# Patient Record
Sex: Female | Born: 1960 | Race: White | Hispanic: No | Marital: Single | Smoking: Current every day smoker
Health system: Southern US, Community
[De-identification: ages and names within clinical notes are randomized; demographics above are authoritative.]

## PROBLEM LIST (undated history)

## (undated) DIAGNOSIS — T8859XA Other complications of anesthesia, initial encounter: Secondary | ICD-10-CM

## (undated) DIAGNOSIS — Z8489 Family history of other specified conditions: Secondary | ICD-10-CM

## (undated) DIAGNOSIS — F32A Depression, unspecified: Secondary | ICD-10-CM

## (undated) DIAGNOSIS — Z8719 Personal history of other diseases of the digestive system: Secondary | ICD-10-CM

## (undated) DIAGNOSIS — F329 Major depressive disorder, single episode, unspecified: Secondary | ICD-10-CM

## (undated) DIAGNOSIS — T4145XA Adverse effect of unspecified anesthetic, initial encounter: Secondary | ICD-10-CM

## (undated) DIAGNOSIS — M719 Bursopathy, unspecified: Secondary | ICD-10-CM

## (undated) DIAGNOSIS — M1990 Unspecified osteoarthritis, unspecified site: Secondary | ICD-10-CM

## (undated) DIAGNOSIS — Z87898 Personal history of other specified conditions: Secondary | ICD-10-CM

## (undated) HISTORY — PX: RETINAL DETACHMENT SURGERY: SHX105

## (undated) HISTORY — PX: GUM SURGERY: SHX658

## (undated) HISTORY — PX: EYE SURGERY: SHX253

## (undated) HISTORY — PX: COLONOSCOPY: SHX174

## (undated) HISTORY — PX: UPPER GI ENDOSCOPY: SHX6162

---
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
## 1988-11-30 HISTORY — PX: CATARACT EXTRACTION W/ INTRAOCULAR LENS  IMPLANT, BILATERAL: SHX1307

## 2000-01-31 ENCOUNTER — Ambulatory Visit (HOSPITAL_COMMUNITY): Admission: RE | Admit: 2000-01-31 | Discharge: 2000-02-01 | Payer: Self-pay | Admitting: Ophthalmology

## 2000-01-31 ENCOUNTER — Encounter: Payer: Self-pay | Admitting: Ophthalmology

## 2004-02-21 ENCOUNTER — Other Ambulatory Visit: Admission: RE | Admit: 2004-02-21 | Discharge: 2004-02-21 | Payer: Self-pay | Admitting: Family Medicine

## 2004-04-26 ENCOUNTER — Ambulatory Visit (HOSPITAL_COMMUNITY): Admission: RE | Admit: 2004-04-26 | Discharge: 2004-04-27 | Payer: Self-pay | Admitting: Ophthalmology

## 2005-03-13 ENCOUNTER — Other Ambulatory Visit: Admission: RE | Admit: 2005-03-13 | Discharge: 2005-03-13 | Payer: Self-pay | Admitting: Family Medicine

## 2007-04-07 ENCOUNTER — Encounter: Admission: RE | Admit: 2007-04-07 | Discharge: 2007-05-15 | Payer: Self-pay | Admitting: Orthopedic Surgery

## 2007-12-30 ENCOUNTER — Ambulatory Visit (HOSPITAL_COMMUNITY): Admission: RE | Admit: 2007-12-30 | Discharge: 2007-12-30 | Payer: Self-pay | Admitting: Gastroenterology

## 2008-04-20 ENCOUNTER — Other Ambulatory Visit: Admission: RE | Admit: 2008-04-20 | Discharge: 2008-04-20 | Payer: Self-pay | Admitting: Family Medicine

## 2009-11-23 IMAGING — US US ABDOMEN COMPLETE
1 series · 14 of 25 positions shown · non-contrast
Comparison: No priors

CLINICAL DATA: Abdominal pain/weight loss

ABDOMEN ULTRASOUND
TECHNIQUE: Complete abdominal ultrasound examination was performed
including evaluation of the liver, gallbladder, bile ducts,
pancreas, kidneys, spleen, IVC, and abdominal aorta.

[Series 1: unknown · 0.22mm/px · 14 of 57 slices shown]
[im 1/57]
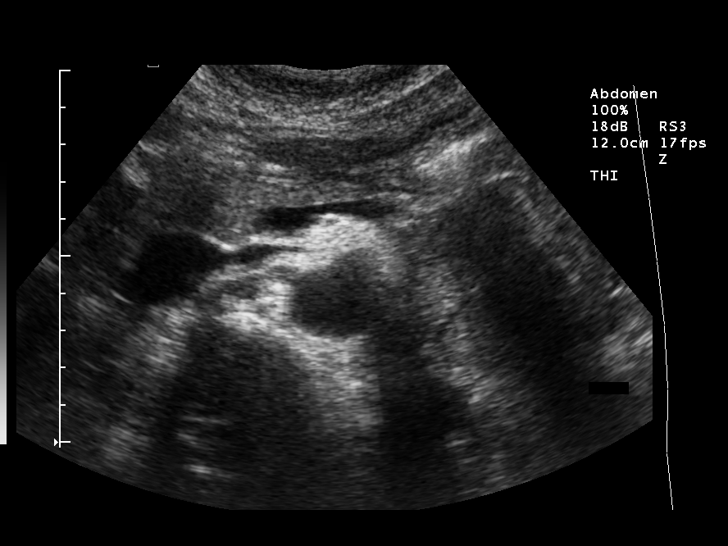
[im 5/57]
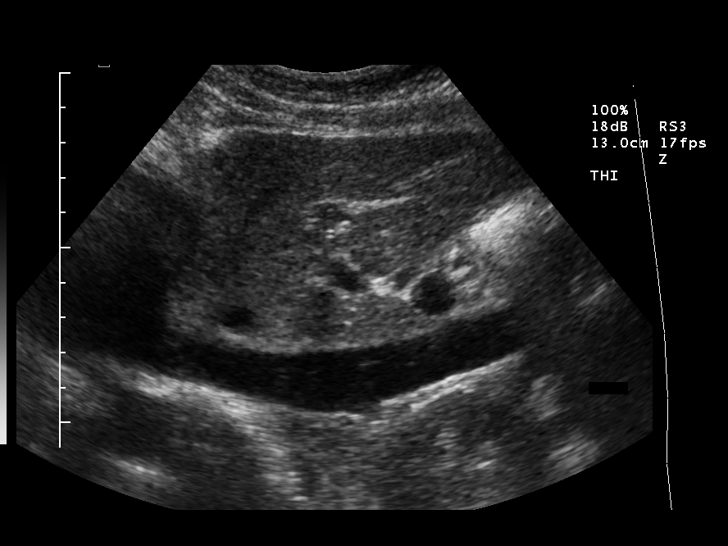
[im 10/57]
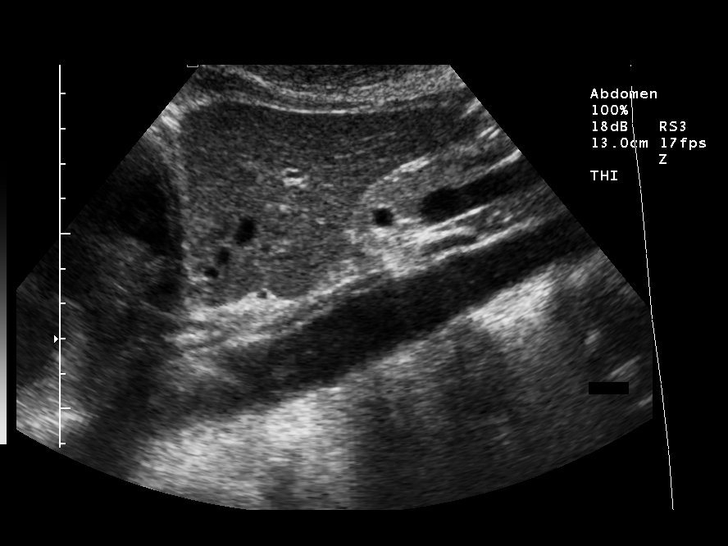
[im 15/57]
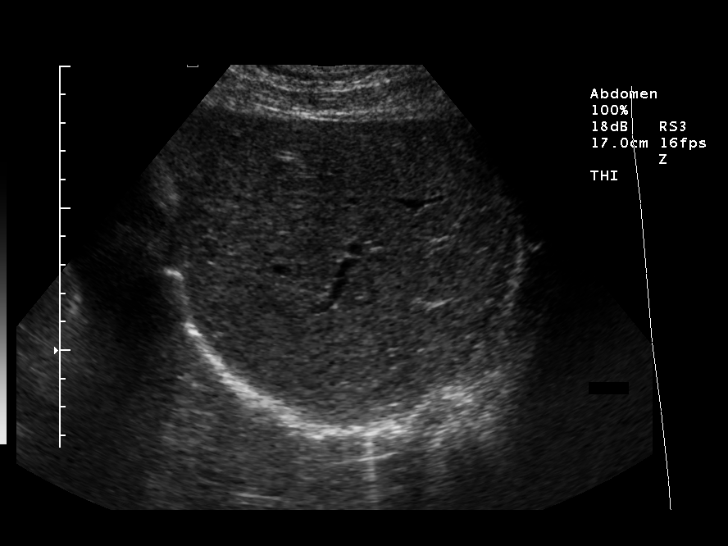
[im 19/57]
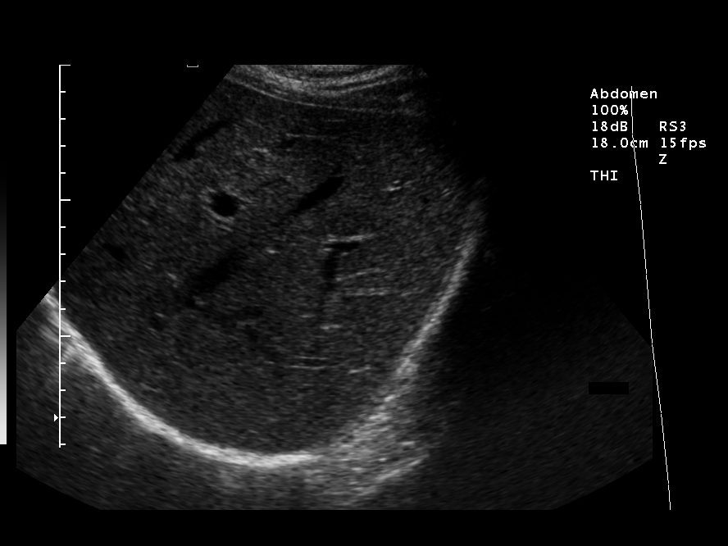
[im 22/57]
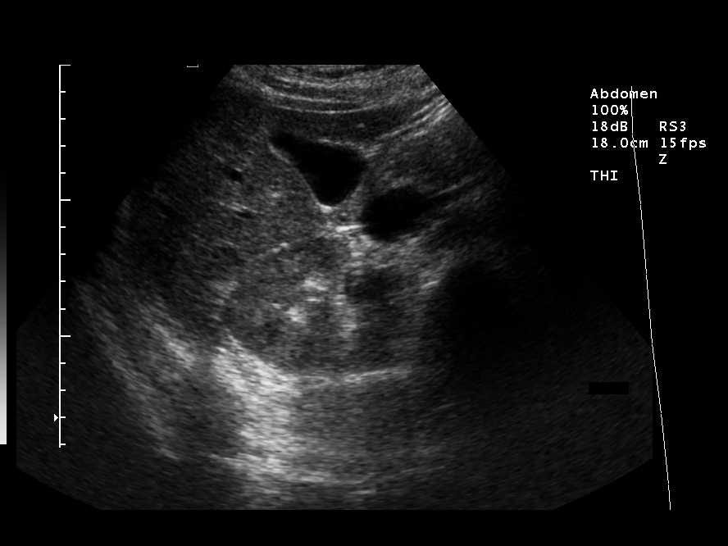
[im 26/57]
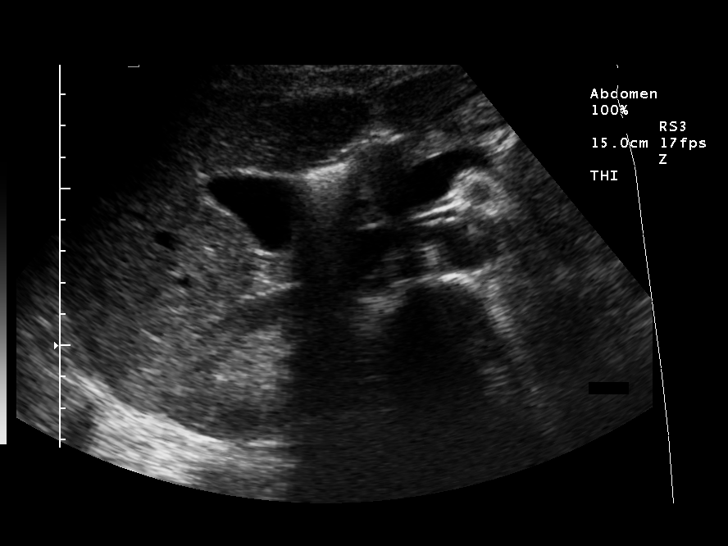
[im 31/57]
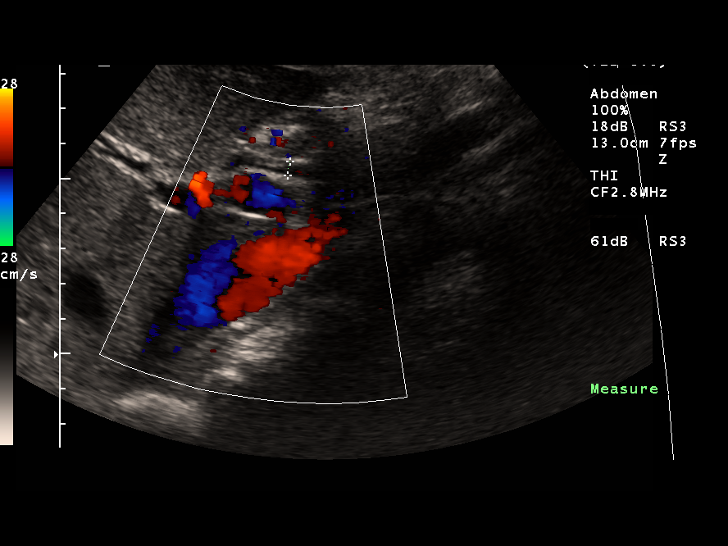
[im 36/57]
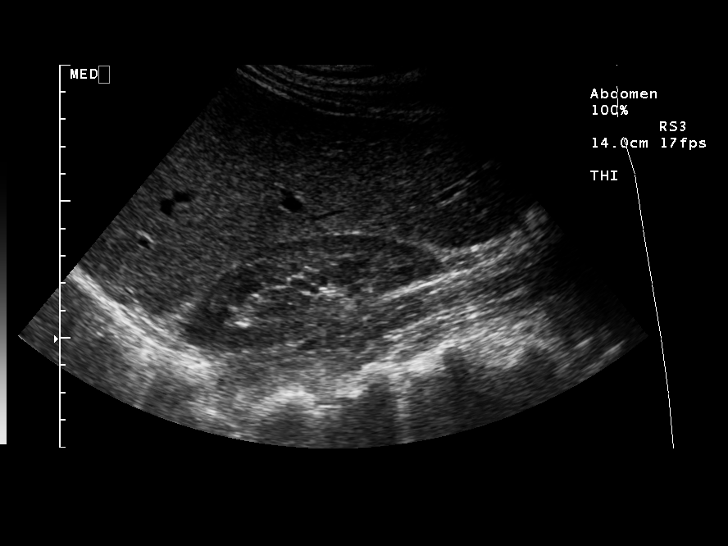
[im 38/57]
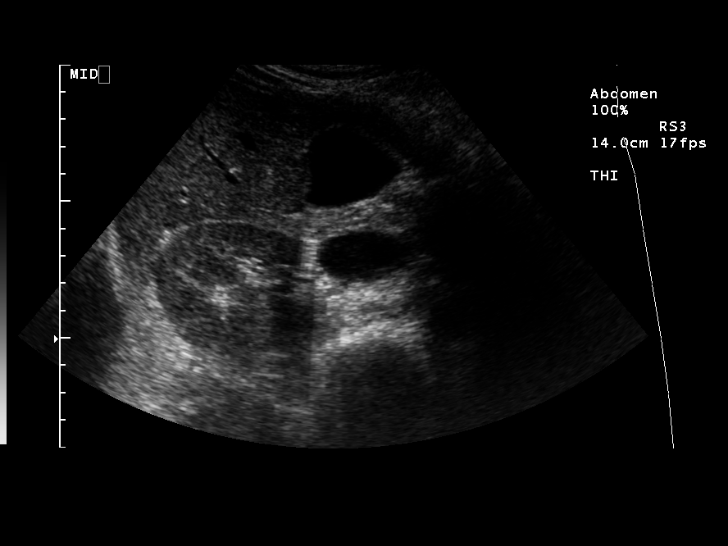
[im 43/57]
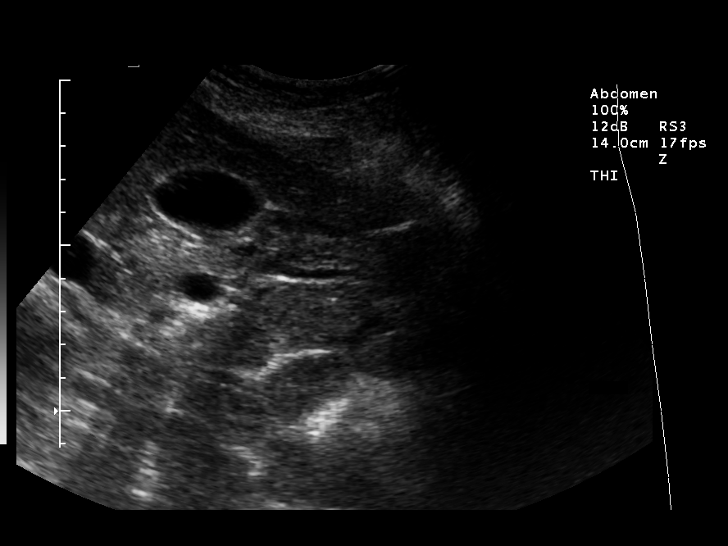
[im 47/57]
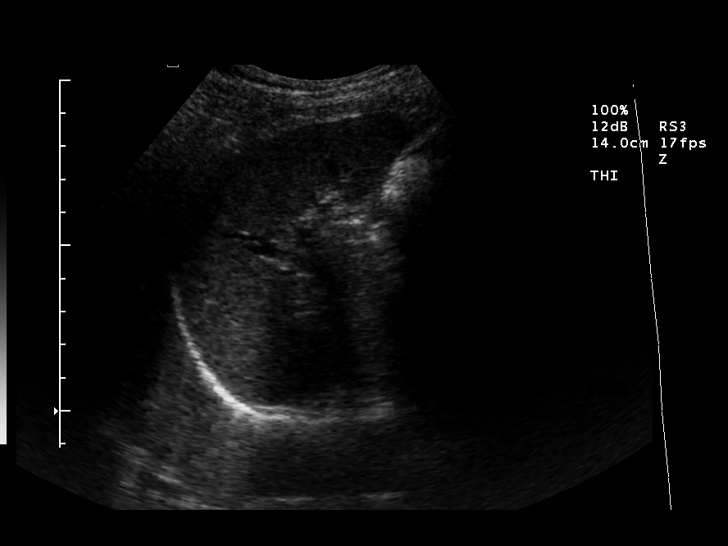
[im 52/57]
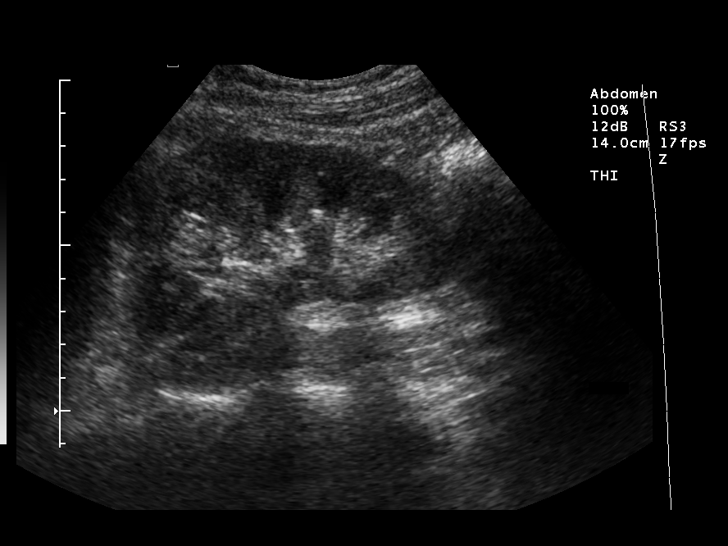
[im 57/57]
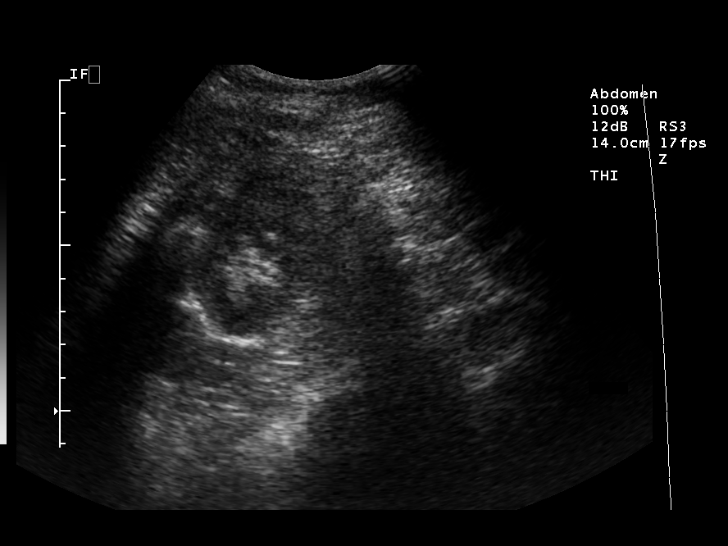

[14 of 25 positions shown; findings below may reference images not displayed]

FINDINGS: Gallbladder and bile ducts normal.  Common duct measured
at 4 mm.

Liver, spleen, pancreas, and kidneys normal.  IVC and aorta normal.
No ascites.
IMPRESSION: No pathological findings.

## 2010-04-12 ENCOUNTER — Emergency Department (HOSPITAL_BASED_OUTPATIENT_CLINIC_OR_DEPARTMENT_OTHER)
Admission: EM | Admit: 2010-04-12 | Discharge: 2010-04-12 | Payer: Self-pay | Source: Home / Self Care | Admitting: Emergency Medicine

## 2010-04-16 LAB — BASIC METABOLIC PANEL
BUN: 11 mg/dL (ref 6–23)
CO2: 25 mEq/L (ref 19–32)
Calcium: 9.4 mg/dL (ref 8.4–10.5)
Chloride: 107 mEq/L (ref 96–112)
Creatinine, Ser: 0.8 mg/dL (ref 0.4–1.2)
GFR calc Af Amer: >60 mL/min (ref >60)
GFR calc non Af Amer: >60 mL/min (ref >60)
Glucose, Bld: 81 mg/dL (ref 70–99)
Potassium: 4.1 mEq/L (ref 3.5–5.1)
Sodium: 144 mEq/L (ref 135–145)

## 2010-04-16 LAB — POCT CARDIAC MARKERS
CKMB, poc: 1.2 ng/mL (ref 1.0–8.0)
Myoglobin, poc: 234 ng/mL (ref 12–200)
Troponin i, poc: <0.05 ng/mL (ref 0.00–0.09)

## 2010-04-16 LAB — DIFFERENTIAL
Basophils Absolute: 0 10*3/uL (ref 0.0–0.1)
Basophils Relative: 0 % (ref 0–1)
Eosinophils Absolute: 0.1 10*3/uL (ref 0.0–0.7)
Eosinophils Relative: 2 % (ref 0–5)
Lymphocytes Relative: 14 % (ref 12–46)
Lymphs Abs: 1.2 10*3/uL (ref 0.7–4.0)
Monocytes Absolute: 0.5 10*3/uL (ref 0.1–1.0)
Monocytes Relative: 7 % (ref 3–12)
Neutro Abs: 6.2 10*3/uL (ref 1.7–7.7)
Neutrophils Relative %: 77 % (ref 43–77)

## 2010-04-16 LAB — URINE MICROSCOPIC-ADD ON

## 2010-04-16 LAB — CBC
HCT: 37.8 % (ref 36.0–46.0)
Hemoglobin: 13.2 g/dL (ref 12.0–15.0)
MCH: 30.6 pg (ref 26.0–34.0)
MCHC: 34.9 g/dL (ref 30.0–36.0)
MCV: 87.5 fL (ref 78.0–100.0)
Platelets: 233 10*3/uL (ref 150–400)
RBC: 4.32 MIL/uL (ref 3.87–5.11)
RDW: 12.5 % (ref 11.5–15.5)
WBC: 8 10*3/uL (ref 4.0–10.5)

## 2010-04-16 LAB — URINALYSIS, ROUTINE W REFLEX MICROSCOPIC
Bilirubin Urine: NEGATIVE
Ketones, ur: NEGATIVE mg/dL
Leukocytes, UA: NEGATIVE
Nitrite: NEGATIVE
Protein, ur: NEGATIVE mg/dL
Specific Gravity, Urine: 1.005 (ref 1.005–1.030)
Urine Glucose, Fasting: NEGATIVE mg/dL
Urobilinogen, UA: 0.2 mg/dL (ref 0.0–1.0)
pH: 7.5 (ref 5.0–8.0)

## 2010-08-17 NOTE — Op Note (Signed)
[place]  Patient:    Leslie Gordon, Leslie Gordon                        MRN: [id number] Proc. Date: 01/31/00 Adm. Date:  [id number] Disc. 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  OPERATION PERFORMED:  Scleral buckle, right eye.  Retinal photocoagulation, right eye.  Gas-fluid exchange, right eye.  SURGEON:  Beulah Gandy. Ashley Royalty, M.D.  ASSISTANT:  Alma Downs, P.A.  ANESTHESIA:  General.  DESCRIPTION OF PROCEDURE:  Usual prep and drape.  360 degree limbal peritomy. Isolation of four rectus muscles on 2-0 silk.  Scleral dissection from 9 oclock around to 3 oclock to admit a #279 intrascleral implant.  Diathermy placed in the bed.  Additional posterior dissection carried out at 12 oclock. Perforation site at 1 oclock.  A moderate amount of clear, colorless, subretinal fluid came forth.  A 508-G segment was placed and the buckle elements were placed.  Two sutures per quadrant were placed in scleral flaps for a total of four, a 240 band placed around the eye with a 270 sleeve at 5 oclock and a belt loop at 4 oclock and 8 oclock.  Indirect ophthalmoscopy showed the retina to be lying nicely on the scleral buckle.  784 burns of endolaser were then placed on the scleral buckle with a power between 400 and 600 milliwatts, 1000 microns each and 0.1 second each.  The band was adjusted to a proper indentation of the globe.  The scleral flap sutures were secured and the free ends removed.  The conjunctiva was reposited with 7-0 chromic suture.  0.6 cc of 100% C3F8 was injected to the 2 oclock pars plana to reinflate the globe and compress the retina.  The conjunctiva was  reposited with 7-0 chromic suture.  Polymyxin and gentamicin were irrigated into Tenons space, atropine solution was applied.  Decadron 10 mg was injected into the lower subconjunctival space.  Closing tension was 10 with a Barraquer tonometer.  COMPLICATIONS:  None.  DURATION:  Two hours.  Polysporin, a patch and shield were placed.  The patient was awakened and taken to recovery in satisfactory condition. DD:  01/31/00 TD:  02/01/00 Job: [id number]

## 2010-08-17 NOTE — Op Note (Signed)
NAME:  COBY, ANTROBUS NO.:  [id number]   MEDICAL RECORD NO.:  [id number]          PATIENT TYPE:  OIB   LOCATION:  2899                         FACILITY:  [facility]   PHYSICIAN:  Beulah Gandy. Ashley Royalty, M.D. DATE OF BIRTH:  [birth year]   DATE OF PROCEDURE:  04/26/2004  DATE OF DISCHARGE:                                 OPERATIVE REPORT   ADMISSION DIAGNOSIS:  Rhegmatogenous retinal detachment in the left eye.   PROCEDURE:  1.  Scleral buckle, left eye.  2.  Retinal photocoagulation, left eye.   SURGEON:  Beulah Gandy. Ashley Royalty, M.D.   ASSISTANT:  Bryan Lemma. Lundquist, P.A.   ANESTHESIA:  General.   DETAILS:  Usual prep and drape, 360-degree limbal peritomy, isolation of 4  rectus muscles on 2-0 silk, localization of break at 2 o'clock with cryopexy  in this area, scleral dissection from 11 o'clock around to 7 o'clock to  admit a #279 intrascleral implant, diathermy placed in the bed, buckle  placed against the globe and 2 sutures per quadrant for a total of 6 sutures  were placed in the scleral flaps.  A 240 band was placed around the eye with  a 270 sleeve at 10 o'clock, perforation site chosen at 4 o'clock with a  moderate amount of clear colorless subretinal fluid coming forth.  The  scleral flaps were closed.  Indirect ophthalmoscopy showed the retina to be  lying nicely on the scleral buckle.  The indirect ophthalmoscope laser was  moved into place; 703 burns were placed around the retinal periphery on the  buckle with a power of 500 mW, 1000 microns each and 0.1 seconds each.  The  buckle was adjusted and trimmed, the band was adjusted and trimmed.  Paracentesis x1 at 2 o'clock, obtained a closing tension of 10 with a  Barraquer keratometer.  Conjunctiva was reposited with 7-0 chromic suture.  Polymox and gentamicin were irrigated into tenon's space.  Atropine solution  was applied as well as TobraDex, a patch and shield.  Marcaine was injected  around the globe for  postop pain.  Closing pressure was 10.  Complications:  None.  Duration:  Two hours.  The patient was awakened and taken to recovery  in satisfactory condition.      JDM/MEDQ  D:  04/26/2004  T:  04/27/2004  Job:  [id number]

## 2011-05-06 ENCOUNTER — Emergency Department (HOSPITAL_BASED_OUTPATIENT_CLINIC_OR_DEPARTMENT_OTHER)
Admission: EM | Admit: 2011-05-06 | Discharge: 2011-05-07 | Disposition: A | Discharge status: Home / Self Care | Payer: BC Managed Care – PPO | Attending: Emergency Medicine | Admitting: Emergency Medicine

## 2011-05-06 ENCOUNTER — Encounter (HOSPITAL_BASED_OUTPATIENT_CLINIC_OR_DEPARTMENT_OTHER): Payer: Self-pay | Admitting: *Deleted

## 2011-05-06 ENCOUNTER — Emergency Department (INDEPENDENT_AMBULATORY_CARE_PROVIDER_SITE_OTHER): Payer: BC Managed Care – PPO

## 2011-05-06 ENCOUNTER — Other Ambulatory Visit: Payer: Self-pay

## 2011-05-06 DIAGNOSIS — B349 Viral infection, unspecified: Secondary | ICD-10-CM

## 2011-05-06 DIAGNOSIS — R509 Fever, unspecified: Secondary | ICD-10-CM

## 2011-05-06 DIAGNOSIS — B9789 Other viral agents as the cause of diseases classified elsewhere: Secondary | ICD-10-CM | POA: Insufficient documentation

## 2011-05-06 DIAGNOSIS — R109 Unspecified abdominal pain: Secondary | ICD-10-CM | POA: Insufficient documentation

## 2011-05-06 DIAGNOSIS — R110 Nausea: Secondary | ICD-10-CM

## 2011-05-06 DIAGNOSIS — R821 Myoglobinuria: Secondary | ICD-10-CM

## 2011-05-06 HISTORY — DX: Bursopathy, unspecified: M71.9

## 2011-05-06 LAB — CBC
HCT: 37 % (ref 36.0–46.0)
Hemoglobin: 12.6 g/dL (ref 12.0–15.0)
MCH: 30.8 pg (ref 26.0–34.0)
MCHC: 34.1 g/dL (ref 30.0–36.0)
MCV: 90.5 fL (ref 78.0–100.0)
Platelets: 236 10*3/uL (ref 150–400)
RBC: 4.09 MIL/uL (ref 3.87–5.11)
RDW: 12.9 % (ref 11.5–15.5)
WBC: 9.1 10*3/uL (ref 4.0–10.5)

## 2011-05-06 LAB — CARDIAC PANEL(CRET KIN+CKTOT+MB+TROPI)
CK, MB: 3.3 ng/mL (ref 0.3–4.0)
Relative Index: 0.3 (ref 0.0–2.5)
Total CK: 1054 U/L — ABNORMAL HIGH (ref 7–177)
Troponin I: <0.3 ng/mL (ref <0.30)

## 2011-05-06 LAB — COMPREHENSIVE METABOLIC PANEL
ALT: 14 U/L (ref 0–35)
AST: 34 U/L (ref 0–37)
Albumin: 4.1 g/dL (ref 3.5–5.2)
Alkaline Phosphatase: 46 U/L (ref 39–117)
BUN: 15 mg/dL (ref 6–23)
CO2: 26 mEq/L (ref 19–32)
Calcium: 9.2 mg/dL (ref 8.4–10.5)
Chloride: 103 mEq/L (ref 96–112)
Creatinine, Ser: 0.9 mg/dL (ref 0.50–1.10)
GFR calc Af Amer: 84 mL/min — ABNORMAL LOW (ref >90)
GFR calc non Af Amer: 73 mL/min — ABNORMAL LOW (ref >90)
Glucose, Bld: 120 mg/dL — ABNORMAL HIGH (ref 70–99)
Potassium: 3.6 mEq/L (ref 3.5–5.1)
Sodium: 139 mEq/L (ref 135–145)
Total Bilirubin: 0.3 mg/dL (ref 0.3–1.2)
Total Protein: 6.7 g/dL (ref 6.0–8.3)

## 2011-05-06 LAB — URINALYSIS, ROUTINE W REFLEX MICROSCOPIC
Bilirubin Urine: NEGATIVE
Glucose, UA: NEGATIVE mg/dL
Ketones, ur: NEGATIVE mg/dL
Leukocytes, UA: NEGATIVE
Nitrite: NEGATIVE
Protein, ur: 30 mg/dL — AB
Specific Gravity, Urine: 1.013 (ref 1.005–1.030)
Urobilinogen, UA: 0.2 mg/dL (ref 0.0–1.0)
pH: 6.5 (ref 5.0–8.0)

## 2011-05-06 LAB — DIFFERENTIAL
Basophils Absolute: 0 10*3/uL (ref 0.0–0.1)
Basophils Relative: 0 % (ref 0–1)
Eosinophils Absolute: 0.2 10*3/uL (ref 0.0–0.7)
Eosinophils Relative: 2 % (ref 0–5)
Lymphocytes Relative: 21 % (ref 12–46)
Lymphs Abs: 1.9 10*3/uL (ref 0.7–4.0)
Monocytes Absolute: 0.6 K/uL (ref 0.1–1.0)
Monocytes Relative: 7 % (ref 3–12)
Neutro Abs: 6.4 10*3/uL (ref 1.7–7.7)
Neutrophils Relative %: 71 % (ref 43–77)

## 2011-05-06 LAB — URINE MICROSCOPIC-ADD ON

## 2011-05-06 LAB — LIPASE, BLOOD: Lipase: 16 U/L (ref 11–59)

## 2011-05-06 MED ORDER — ONDANSETRON HCL 4 MG/2ML IJ SOLN
4.0000 mg | Freq: Once | INTRAMUSCULAR | Status: AC
  Administered 2011-05-06: 4 mg via INTRAVENOUS
  Filled 2011-05-06: qty 2

## 2011-05-06 MED ORDER — SODIUM CHLORIDE 0.9 % IV BOLUS (SEPSIS)
1000.0000 mL | Freq: Once | INTRAVENOUS | Status: AC
  Administered 2011-05-06: 1000 mL via INTRAVENOUS

## 2011-05-06 MED ORDER — ACETAMINOPHEN 325 MG PO TABS
650.0000 mg | ORAL_TABLET | Freq: Once | ORAL | Status: AC
  Administered 2011-05-06: 650 mg via ORAL
  Filled 2011-05-06: qty 2

## 2011-05-06 MED ORDER — SODIUM CHLORIDE 0.9 % IV BOLUS (SEPSIS)
1000.0000 mL | Freq: Once | INTRAVENOUS | Status: AC
  Administered 2011-05-07: 1000 mL via INTRAVENOUS

## 2011-05-06 NOTE — ED Provider Notes (Signed)
History     CSN: [id number]  Arrival date & time 05/06/11  2109   First MD Initiated Contact with Patient 05/06/11 2251      Chief Complaint  Patient presents with  . Abdominal Pain    (Consider location/radiation/quality/duration/timing/severity/associated sxs/prior treatment) HPI Comments: Patient reports that 3 days ago she did travel out of town about a 2-1/2 hour drive in order to attend a family members wedding. On the day that they arrived, she became overly fatigued with associated chills and feeling hot and cold. She reports that she felt achy all over and wanted to go to bed early. She reports that evening she did have a severe muscle cramps of her right calf area. She reports that the muscles seemed locked and that when she tried to stand up to stretch her calf area, she could not put the heel of her foot flat on the ground because it was so tense. She reports eventually she was able to massage her muscle cramp out. She reports that her right leg remains somewhat sore. She denies any runny nose, sinus congestion. She reports a slightly scratchy throat. She denies any significant cough. She reports no skin rash or neck stiffness. She denies any significant headache. She reports a slightly decreased appetite but no nausea vomiting or diarrhea. She reports that she smokes but does not drink alcohol. 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 She denies any prior surgeries. She reports that she has had an occasional sharp pain in her left abdomen both upper and lower but has been intermittent and lasts only a brief amount of time. She did not have any abdominal pain at this moment. She denies chest pain. 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 She denies any palpitations. She denies any pleuritic chest pain or back pain. She denies dysuria or hematuria. She denies any new medications. She denies any swelling in either of her lower  extremities. She reports that tonight she continued to feel poorly, therefore she spoke to the nurse line for her insurance directed her to speak to her on-call nursing line. She spoke to the on-call physician who recommended that she come to the emergency department for evaluation.  Patient is a 51 y.o. female presenting with abdominal pain. The history is provided by the patient.  Abdominal Pain The primary symptoms of the illness include abdominal pain, fever and fatigue. The primary symptoms of the illness do not include shortness of breath, nausea, vomiting, diarrhea or dysuria.  Additional symptoms associated with the illness include chills. Symptoms associated with the illness do not include constipation.    Past Medical History  Diagnosis Date  . Bursitis     Past Surgical History  Procedure Date  . Eye surgery     No family history on file.  History  Substance Use Topics  . Smoking status: Current Everyday Smoker -- 0.5 packs/day  . Smokeless tobacco: Not on file  . Alcohol Use: No    OB History    Grav Para Term Preterm Abortions TAB SAB Ect Mult Living                  Review of Systems  Constitutional: Positive for fever, chills, appetite change and fatigue.  HENT: Positive for sore throat. Negative for nosebleeds, congestion, rhinorrhea and sinus pressure.   Respiratory: Negative for cough, shortness of breath and wheezing.   Cardiovascular: Negative for chest pain and leg  swelling.  Gastrointestinal: Positive for abdominal pain. Negative for nausea, vomiting, diarrhea, constipation and blood in stool.  Genitourinary: Negative for dysuria and flank pain.  Musculoskeletal: Positive for myalgias.  Skin: Negative for color change, pallor, rash and wound.  Neurological: Negative for syncope, weakness, light-headedness, numbness and headaches.  All other systems reviewed and are negative.    Allergies  Review of patient's allergies indicates not on  file.  Home Medications   Current Outpatient Rx  Name Route Sig Dispense Refill  . SUPER B COMPLEX PO TABS Oral Take 1 tablet by mouth daily.    Marland Kitchen VITAMIN D 2000 UNITS PO CAPS Oral Take 2,000 Units by mouth daily.    . OMEGA-3 FATTY ACIDS 1000 MG PO CAPS Oral Take 1 g by mouth daily.    . MELOXICAM 15 MG PO TABS Oral Take 15 mg by mouth daily.    Marland Kitchen OVER THE COUNTER MEDICATION Oral Take 1 tablet by mouth daily. Ashwagandha    . SAM-E 400 MG PO TABS Oral Take 400 mg by mouth daily.      BP 128/69  Pulse 64  Temp(Src) 98.2 F (36.8 C) (Oral)  Resp 17  SpO2 98%  Physical Exam  Nursing note and vitals reviewed. Constitutional: She is oriented to person, place, and time. She appears well-developed and well-nourished. No distress.  HENT:  Head: Normocephalic and atraumatic.  Eyes: Pupils are equal, round, and reactive to light.  Neck: Normal range of motion. Neck supple. No tracheal deviation present.  Cardiovascular: Normal rate.   No murmur heard. Pulmonary/Chest: Effort normal. She has no wheezes. She has no rales.  Abdominal: Soft. She exhibits no distension. There is no tenderness. There is no rebound and no guarding.  Musculoskeletal: She exhibits no edema and no tenderness.  Lymphadenopathy:    She has no cervical adenopathy.  Neurological: She is alert and oriented to person, place, and time. No cranial nerve deficit. Coordination normal.  Skin: Skin is warm and dry. No rash noted. She is not diaphoretic. No erythema. No pallor.  Psychiatric: She has a normal mood and affect.    ED Course  Procedures (including critical care time)  Labs Reviewed  URINALYSIS, ROUTINE W REFLEX MICROSCOPIC - Abnormal; Notable for the following:    Hgb urine dipstick LARGE (*)    Protein, ur 30 (*)    All other components within normal limits  COMPREHENSIVE METABOLIC PANEL - Abnormal; Notable for the following:    Glucose, Bld 120 (*)    GFR calc non Af Amer 73 (*)    GFR calc Af Amer  84 (*)    All other components within normal limits  CARDIAC PANEL(CRET KIN+CKTOT+MB+TROPI) - Abnormal; Notable for the following:    Total CK 1054 (*)    All other components within normal limits  URINE MICROSCOPIC-ADD ON  CBC  DIFFERENTIAL  LIPASE, BLOOD   Dg Abd Acute W/chest  05/07/2011  *RADIOLOGY REPORT*  Clinical Data: Abdominal pain, nausea  ACUTE ABDOMEN SERIES (ABDOMEN 2 VIEW & CHEST 1 VIEW)  Comparison: 04/12/2010  Findings: Heart size upper limits normal.  Prominent pulmonary interstitial markings without confluent airspace infiltrate or overt edema.  No effusion.  No free air.  Normal bowel gas pattern.  No abnormal abdominal calcifications. A few punctate metallic densities project in the lumen of the stomach, presumably ingested material.  Regional bones unremarkable.  IMPRESSION:  1.  Normal bowel gas pattern. 2.  No free air. 3.  No acute cardiopulmonary  disease.  Original Report Authenticated By: Thora Lance III, M.D.     1. Viral illness   2. Myoglobinuria      Room air saturation is 98% which is normal. ECG performed at 22:10, shows a sinus bradycardia at a rate of 59. There is a rightward axis. Poor R wave progression between leads V1 through V3. Otherwise normal intervals and no ST or T-wave abnormalities. ECG is unchanged compared to 04/12/2010.  MDM   Patient without any specific focus for infection. She did have an oral temperature of 99.9. Based on her symptoms, she likely does have a viral illness with associated myalgias. She does affirm that she has been mainly lying in bed. Her CK total is elevated thousand but has no evidence of any renal failure. She does have some myoglobinuria. She denies taking any new medications, specifically no new cholesterol medications. She does take Mobic for arthritis and reports that she did get some injections into her arthritis joints a couple weeks ago.  My plan is to hydrate her with aggressive IV fluids and monitor her for  improvement of her symptoms. Tylenol is given to her for her mild fever.      12:23 AM Patient reports feeling somewhat improved. Her vital signs remained stable. She has been hydrated. She is tolerating by mouth's. Unbeknownst to me, another patient had been seeing at the same time is actually the patient's daughter. Patient's daughter also has been having some headaches with some mild chills and nausea. I feel likely both family members have the same illness which is mild viral illness with vague symptoms and low-grade fevers. Given that they are both sick, this reassures me that this likely is not some sort of unusual bacterial infection. Have encouraged the patient to call her primary care physician's office again and to arrange followup for next week to ensure that she is resolving. She'll receive a work note. She is encouraged to drink plenty of fluids and to treat her symptoms with Tylenol and ibuprofen.  Gavin Pound. Oletta Lamas, MD 05/07/11 1610

## 2011-05-06 NOTE — ED Notes (Signed)
Nausea, abdominal pain left side of her abdomen, fever, and chills for a few days.

## 2011-05-06 NOTE — ED Notes (Addendum)
Pt c/o fatingue, intermittent chest pain, LUQ and LLQ abd pain with nausea, chills, and episodes of diaphoresis. Onset of symptoms sat.

## 2011-05-07 NOTE — Discharge Instructions (Signed)
 Viral Infections A viral infection can be caused by different types of viruses.Most viral infections are not serious and resolve on their own. However, some infections may cause severe symptoms and may lead to further complications. SYMPTOMS Viruses can frequently cause:  Minor sore throat.   Aches and pains.   Headaches.   Runny nose.   Different types of rashes.   Watery eyes.   Tiredness.   Cough.   Loss of appetite.   Gastrointestinal infections, resulting in nausea, vomiting, and diarrhea.  These symptoms do not respond to antibiotics because the infection is not caused by bacteria. However, you might catch a bacterial infection following the viral infection. This is sometimes called a superinfection. Symptoms of such a bacterial infection may include:  Worsening sore throat with pus and difficulty swallowing.   Swollen neck glands.   Chills and a high or persistent fever.   Severe headache.   Tenderness over the sinuses.   Persistent overall ill feeling (malaise), muscle aches, and tiredness (fatigue).   Persistent cough.   Yellow, green, or brown mucus production with coughing.  HOME CARE INSTRUCTIONS   Only take over-the-counter or prescription medicines for pain, discomfort, diarrhea, or fever as directed by your caregiver.   Drink enough water and fluids to keep your urine clear or pale yellow. Sports drinks can provide valuable electrolytes, sugars, and hydration.   Get plenty of rest and maintain proper nutrition. Soups and broths with crackers or rice are fine.  SEEK IMMEDIATE MEDICAL CARE IF:   You have severe headaches, shortness of breath, chest pain, neck pain, or an unusual rash.   You have uncontrolled vomiting, diarrhea, or you are unable to keep down fluids.   You or your child has an oral temperature above 102 F (38.9 C), not controlled by medicine.  MAKE SURE YOU:   Understand these instructions.   Will watch your condition.    Will get help right away if you are not doing well or get worse.  Document Released: 12/26/2004 Document Revised: 11/28/2010 Document Reviewed: 07/23/2010 [facility] Patient Information 2012 [place], [place].    Muscle Cramps Muscle cramps are due to sudden involuntary muscle contraction. This means you have no control over the tightening of a muscle (or muscles). Often there are no obvious causes. Muscle cramps may occur with overexertion. They may also occur with chilling of the muscles. An example of a muscle chilling activity is swimming. It is uncommon for cramps to be due to a serious underlying disorder. In most cases, muscle cramps improve (or leave) within minutes. CAUSES  Some common causes are:  Injury.   Infections, especially viral.   Abnormal levels of the salts and ions in your blood (electrolytes). This could happen if you are taking water pills (diuretics).   Blood vessel disease where not enough blood is getting to the muscles (intermittent claudication).  Some uncommon causes are:  Side effects of some medicine (such as lithium).   Alcohol abuse.   Diseases where there is soreness (inflammation) of the muscular system.  HOME CARE INSTRUCTIONS   It may be helpful to massage, stretch, and relax the affected muscle.   Taking a dose of over-the-counter diphenhydramine is helpful for night leg cramps.  SEEK MEDICAL CARE IF:  Cramps are frequent and not relieved with medicine. MAKE SURE YOU:   Understand these instructions.   Will watch your condition.   Will get help right away if you are not doing well or get worse.  Document  Released: 09/07/2001 Document Revised: 11/28/2010 Document Reviewed: 03/09/2008 [facility] Patient Information 2012 [place], [place].

## 2011-05-14 ENCOUNTER — Encounter (INDEPENDENT_AMBULATORY_CARE_PROVIDER_SITE_OTHER): Payer: Self-pay | Admitting: Ophthalmology

## 2011-05-23 ENCOUNTER — Encounter (INDEPENDENT_AMBULATORY_CARE_PROVIDER_SITE_OTHER): Payer: BC Managed Care – PPO | Admitting: Ophthalmology

## 2011-05-23 DIAGNOSIS — H33009 Unspecified retinal detachment with retinal break, unspecified eye: Secondary | ICD-10-CM

## 2011-05-23 DIAGNOSIS — D3130 Benign neoplasm of unspecified choroid: Secondary | ICD-10-CM

## 2011-05-23 DIAGNOSIS — H43819 Vitreous degeneration, unspecified eye: Secondary | ICD-10-CM

## 2012-05-22 ENCOUNTER — Ambulatory Visit (INDEPENDENT_AMBULATORY_CARE_PROVIDER_SITE_OTHER): Payer: BC Managed Care – PPO | Admitting: Ophthalmology

## 2012-05-22 DIAGNOSIS — D3130 Benign neoplasm of unspecified choroid: Secondary | ICD-10-CM

## 2013-05-28 ENCOUNTER — Ambulatory Visit (INDEPENDENT_AMBULATORY_CARE_PROVIDER_SITE_OTHER): Payer: BC Managed Care – PPO | Admitting: Ophthalmology

## 2013-06-24 ENCOUNTER — Ambulatory Visit (INDEPENDENT_AMBULATORY_CARE_PROVIDER_SITE_OTHER): Payer: 59 | Admitting: Ophthalmology

## 2013-06-24 DIAGNOSIS — D3130 Benign neoplasm of unspecified choroid: Secondary | ICD-10-CM

## 2013-06-24 DIAGNOSIS — H33009 Unspecified retinal detachment with retinal break, unspecified eye: Secondary | ICD-10-CM

## 2013-06-24 DIAGNOSIS — H43819 Vitreous degeneration, unspecified eye: Secondary | ICD-10-CM

## 2013-06-24 DIAGNOSIS — H27139 Posterior dislocation of lens, unspecified eye: Secondary | ICD-10-CM

## 2013-06-28 ENCOUNTER — Encounter (HOSPITAL_COMMUNITY): Payer: Self-pay | Admitting: Pharmacy Technician

## 2013-06-28 ENCOUNTER — Encounter (HOSPITAL_COMMUNITY): Payer: Self-pay | Admitting: *Deleted

## 2013-06-28 NOTE — H&P (Signed)
Leslie Gordon is an 53 y.o. female.   Chief Complaint:Shadows in the vision left eye HPI: Had cataract surgery in 1990 Left eye.  Lens has dislocated  Past Medical History  Diagnosis Date  . Bursitis   . Complication of anesthesia     "takes a while to wear off"  . History of palpitations     Hx: of  . Shortness of breath     Hx: of  . Arthritis   . Depression     Hx: of 2002  . H/O hiatal hernia   . TOIZTIWP(809.9)     Past Surgical History  Procedure Laterality Date  . Eye surgery      Retina detachment  . Cataract extraction w/ intraocular lens  implant, bilateral    . Dental surgery    . Colonoscopy    . Upper gi endoscopy    . Foot surgery      Hx: of left    Family History  Problem Relation Age of Onset  . Osteoporosis Mother   . Arthritis Mother   . Heart disease Mother   . Stroke Father   . Cancer - Other Father   . Osteoporosis Brother   . Asthma Brother    Social History:  reports that she has been smoking Cigarettes.  She has been smoking about 0.50 packs per day. She has never used smokeless tobacco. She reports that she does not drink alcohol or use illicit drugs.  Allergies:  Allergies  Allergen Reactions  . Sulfa Antibiotics Hives  . Wellbutrin [Bupropion] Other (See Comments)    REACTION: Intense headaches    No prescriptions prior to admission    Review of systems otherwise negative  There were no vitals taken for this visit.  Physical exam: Mental status: oriented x3. Eyes: See eye exam associated with this date of surgery in media tab.  Scanned in by scanning center Ears, Nose, Throat: within normal limits Neck: Within Normal limits General: within normal limits Chest: Within normal limits Breast: deferred Heart: Within normal limits Abdomen: Within normal limits GU: deferred Extremities: within normal limits Skin: within normal limits  Assessment/Plan Dislocated intraocular lens left eye Plan: To [facility] for Pars  plana vitrectomy, removal of intraocular lens from vitreous. Placement of secondary IOL with suture. Laser and gas.  Left eye  Hayden Pedro 06/28/2013, 5:39 PM

## 2013-06-29 ENCOUNTER — Encounter (HOSPITAL_COMMUNITY)
Admission: RE | Disposition: A | Discharge status: Home / Self Care | Payer: Self-pay | Source: Ambulatory Visit | Attending: Ophthalmology

## 2013-06-29 ENCOUNTER — Ambulatory Visit (HOSPITAL_COMMUNITY): Payer: 59

## 2013-06-29 ENCOUNTER — Ambulatory Visit (HOSPITAL_COMMUNITY): Payer: 59 | Admitting: Anesthesiology

## 2013-06-29 ENCOUNTER — Encounter (HOSPITAL_COMMUNITY): Payer: Self-pay | Admitting: *Deleted

## 2013-06-29 ENCOUNTER — Ambulatory Visit (HOSPITAL_COMMUNITY)
Admission: RE | Admit: 2013-06-29 | Discharge: 2013-06-30 | Disposition: A | Discharge status: Home / Self Care | Payer: 59 | Source: Ambulatory Visit | Attending: Ophthalmology | Admitting: Ophthalmology

## 2013-06-29 ENCOUNTER — Encounter (HOSPITAL_COMMUNITY): Payer: 59 | Admitting: Anesthesiology

## 2013-06-29 DIAGNOSIS — H27139 Posterior dislocation of lens, unspecified eye: Secondary | ICD-10-CM

## 2013-06-29 DIAGNOSIS — T8529XA Other mechanical complication of intraocular lens, initial encounter: Secondary | ICD-10-CM | POA: Insufficient documentation

## 2013-06-29 DIAGNOSIS — K449 Diaphragmatic hernia without obstruction or gangrene: Secondary | ICD-10-CM | POA: Insufficient documentation

## 2013-06-29 DIAGNOSIS — H27132 Posterior dislocation of lens, left eye: Secondary | ICD-10-CM | POA: Diagnosis present

## 2013-06-29 DIAGNOSIS — Y849 Medical procedure, unspecified as the cause of abnormal reaction of the patient, or of later complication, without mention of misadventure at the time of the procedure: Secondary | ICD-10-CM | POA: Insufficient documentation

## 2013-06-29 DIAGNOSIS — F17200 Nicotine dependence, unspecified, uncomplicated: Secondary | ICD-10-CM | POA: Insufficient documentation

## 2013-06-29 DIAGNOSIS — T8522XA Displacement of intraocular lens, initial encounter: Secondary | ICD-10-CM

## 2013-06-29 HISTORY — DX: Family history of other specified conditions: Z84.89

## 2013-06-29 HISTORY — PX: GAS/FLUID EXCHANGE: SHX5334

## 2013-06-29 HISTORY — DX: Personal history of other diseases of the digestive system: Z87.19

## 2013-06-29 HISTORY — DX: Personal history of other specified conditions: Z87.898

## 2013-06-29 HISTORY — DX: Unspecified osteoarthritis, unspecified site: M19.90

## 2013-06-29 HISTORY — PX: PARS PLANA VITRECTOMY: SHX2166

## 2013-06-29 HISTORY — DX: Depression, unspecified: F32.A

## 2013-06-29 HISTORY — DX: Adverse effect of unspecified anesthetic, initial encounter: T41.45XA

## 2013-06-29 HISTORY — DX: Major depressive disorder, single episode, unspecified: F32.9

## 2013-06-29 HISTORY — DX: Other complications of anesthesia, initial encounter: T88.59XA

## 2013-06-29 LAB — CBC
HEMATOCRIT: 37.4 % (ref 36.0–46.0)
HEMOGLOBIN: 13 g/dL (ref 12.0–15.0)
MCH: 31.2 pg (ref 26.0–34.0)
MCHC: 34.8 g/dL (ref 30.0–36.0)
MCV: 89.7 fL (ref 78.0–100.0)
Platelets: 216 10*3/uL (ref 150–400)
RBC: 4.17 MIL/uL (ref 3.87–5.11)
RDW: 12.9 % (ref 11.5–15.5)
WBC: 5.4 10*3/uL (ref 4.0–10.5)

## 2013-06-29 SURGERY — PARS PLANA VITRECTOMY WITH 25G REMOVAL/SUTURE INTRAOCULAR LENS
Anesthesia: General | Site: Eye | Laterality: Left

## 2013-06-29 MED ORDER — GATIFLOXACIN 0.5 % OP SOLN
1.0000 [drp] | OPHTHALMIC | Status: AC | PRN
  Administered 2013-06-29: 1 [drp] via OPHTHALMIC
  Filled 2013-06-29: qty 2.5

## 2013-06-29 MED ORDER — PROPOFOL 10 MG/ML IV BOLUS
INTRAVENOUS | Status: DC | PRN
  Administered 2013-06-29: 160 mg via INTRAVENOUS

## 2013-06-29 MED ORDER — ACETAZOLAMIDE SODIUM 500 MG IJ SOLR
500.0000 mg | Freq: Once | INTRAMUSCULAR | Status: AC
  Administered 2013-06-30: 500 mg via INTRAVENOUS
  Filled 2013-06-29: qty 500

## 2013-06-29 MED ORDER — TROPICAMIDE 0.5 % OP SOLN: 1.0000 [drp] | OPHTHALMIC | Status: DC | PRN

## 2013-06-29 MED ORDER — BACITRACIN-POLYMYXIN B 500-10000 UNIT/GM OP OINT
TOPICAL_OINTMENT | OPHTHALMIC | Status: DC | PRN
  Administered 2013-06-29: 1 via OPHTHALMIC

## 2013-06-29 MED ORDER — TROPICAMIDE 1 % OP SOLN
1.0000 [drp] | OPHTHALMIC | Status: AC | PRN
  Administered 2013-06-29 (×2): 1 [drp] via OPHTHALMIC
  Filled 2013-06-29: qty 2

## 2013-06-29 MED ORDER — TETRACAINE HCL 0.5 % OP SOLN
2.0000 [drp] | Freq: Once | OPHTHALMIC | Status: DC
  Filled 2013-06-29: qty 2

## 2013-06-29 MED ORDER — MIDAZOLAM HCL 2 MG/2ML IJ SOLN
INTRAMUSCULAR | Status: AC
  Filled 2013-06-29: qty 2

## 2013-06-29 MED ORDER — CYCLOPENTOLATE HCL 1 % OP SOLN
1.0000 [drp] | OPHTHALMIC | Status: AC | PRN
  Administered 2013-06-29 (×2): 1 [drp] via OPHTHALMIC
  Filled 2013-06-29: qty 2

## 2013-06-29 MED ORDER — FENTANYL CITRATE 0.05 MG/ML IJ SOLN
INTRAMUSCULAR | Status: DC | PRN
  Administered 2013-06-29: 50 ug via INTRAVENOUS
  Administered 2013-06-29: 100 ug via INTRAVENOUS

## 2013-06-29 MED ORDER — BSS PLUS IO SOLN
INTRAOCULAR | Status: DC | PRN
  Administered 2013-06-29: 1 via INTRAOCULAR

## 2013-06-29 MED ORDER — TROPICAMIDE 1 % OP SOLN
1.0000 [drp] | OPHTHALMIC | Status: AC | PRN
  Administered 2013-06-29: 1 [drp] via OPHTHALMIC
  Filled 2013-06-29 (×2): qty 3

## 2013-06-29 MED ORDER — HYDROCODONE-ACETAMINOPHEN 5-325 MG PO TABS
1.0000 | ORAL_TABLET | ORAL | Status: DC | PRN
  Administered 2013-06-29: 1 via ORAL
  Filled 2013-06-29: qty 1

## 2013-06-29 MED ORDER — GLYCOPYRROLATE 0.2 MG/ML IJ SOLN
INTRAMUSCULAR | Status: AC
  Filled 2013-06-29: qty 3

## 2013-06-29 MED ORDER — 0.9 % SODIUM CHLORIDE (POUR BTL) OPTIME
TOPICAL | Status: DC | PRN
  Administered 2013-06-29: 200 mL

## 2013-06-29 MED ORDER — ROCURONIUM BROMIDE 100 MG/10ML IV SOLN
INTRAVENOUS | Status: DC | PRN
  Administered 2013-06-29: 50 mg via INTRAVENOUS

## 2013-06-29 MED ORDER — ONDANSETRON HCL 4 MG/2ML IJ SOLN
INTRAMUSCULAR | Status: DC | PRN
  Administered 2013-06-29: 4 mg via INTRAVENOUS

## 2013-06-29 MED ORDER — GATIFLOXACIN 0.5 % OP SOLN
1.0000 [drp] | Freq: Four times a day (QID) | OPHTHALMIC | Status: DC
  Filled 2013-06-29: qty 2.5

## 2013-06-29 MED ORDER — BUPIVACAINE HCL (PF) 0.75 % IJ SOLN
INTRAMUSCULAR | Status: DC | PRN
  Administered 2013-06-29: 10 mL

## 2013-06-29 MED ORDER — SODIUM CHLORIDE 0.45 % IV SOLN
INTRAVENOUS | Status: DC
  Administered 2013-06-29: 20 mL/h via INTRAVENOUS

## 2013-06-29 MED ORDER — CYCLOPENTOLATE HCL 1 % OP SOLN
1.0000 [drp] | OPHTHALMIC | Status: AC | PRN
  Administered 2013-06-29: 1 [drp] via OPHTHALMIC
  Filled 2013-06-29 (×2): qty 2

## 2013-06-29 MED ORDER — MORPHINE SULFATE 2 MG/ML IJ SOLN: 1.0000 mg | INTRAMUSCULAR | Status: DC | PRN

## 2013-06-29 MED ORDER — ARTIFICIAL TEARS OP OINT
TOPICAL_OINTMENT | OPHTHALMIC | Status: AC
  Filled 2013-06-29: qty 3.5

## 2013-06-29 MED ORDER — NEOSTIGMINE METHYLSULFATE 1 MG/ML IJ SOLN
INTRAMUSCULAR | Status: DC | PRN
  Administered 2013-06-29: 4 mg via INTRAVENOUS

## 2013-06-29 MED ORDER — ONDANSETRON HCL 4 MG/2ML IJ SOLN
INTRAMUSCULAR | Status: AC
  Filled 2013-06-29: qty 2

## 2013-06-29 MED ORDER — GLYCOPYRROLATE 0.2 MG/ML IJ SOLN
INTRAMUSCULAR | Status: DC | PRN
  Administered 2013-06-29: 0.6 mg via INTRAVENOUS

## 2013-06-29 MED ORDER — ONDANSETRON HCL 4 MG/2ML IJ SOLN
4.0000 mg | Freq: Four times a day (QID) | INTRAMUSCULAR | Status: DC | PRN
  Filled 2013-06-29: qty 2

## 2013-06-29 MED ORDER — SODIUM HYALURONATE 10 MG/ML IO SOLN
INTRAOCULAR | Status: DC | PRN
  Administered 2013-06-29: 0.85 mL via INTRAOCULAR

## 2013-06-29 MED ORDER — GELATIN ABSORBABLE 12-7 MM EX MISC
CUTANEOUS | Status: DC | PRN
  Administered 2013-06-29: 1

## 2013-06-29 MED ORDER — DEXAMETHASONE SODIUM PHOSPHATE 10 MG/ML IJ SOLN
INTRAMUSCULAR | Status: DC | PRN
  Administered 2013-06-29: 10 mg via INTRAVENOUS

## 2013-06-29 MED ORDER — MIDAZOLAM HCL 5 MG/5ML IJ SOLN
INTRAMUSCULAR | Status: DC | PRN
  Administered 2013-06-29: 2 mg via INTRAVENOUS

## 2013-06-29 MED ORDER — PHENYLEPHRINE HCL 2.5 % OP SOLN
1.0000 [drp] | OPHTHALMIC | Status: AC | PRN
  Administered 2013-06-29: 1 [drp] via OPHTHALMIC
  Filled 2013-06-29 (×2): qty 15

## 2013-06-29 MED ORDER — LIDOCAINE HCL (CARDIAC) 20 MG/ML IV SOLN
INTRAVENOUS | Status: DC | PRN
  Administered 2013-06-29: 80 mg via INTRAVENOUS

## 2013-06-29 MED ORDER — ACETAMINOPHEN 325 MG PO TABS: 325.0000 mg | ORAL_TABLET | ORAL | Status: DC | PRN

## 2013-06-29 MED ORDER — ROCURONIUM BROMIDE 50 MG/5ML IV SOLN
INTRAVENOUS | Status: AC
  Filled 2013-06-29: qty 1

## 2013-06-29 MED ORDER — TEMAZEPAM 15 MG PO CAPS: 15.0000 mg | ORAL_CAPSULE | Freq: Every evening | ORAL | Status: DC | PRN

## 2013-06-29 MED ORDER — LACTATED RINGERS IV SOLN: INTRAVENOUS | Status: DC

## 2013-06-29 MED ORDER — LACTATED RINGERS IV SOLN
INTRAVENOUS | Status: DC | PRN
  Administered 2013-06-29: 12:00:00 via INTRAVENOUS

## 2013-06-29 MED ORDER — NEOSTIGMINE METHYLSULFATE 1 MG/ML IJ SOLN
INTRAMUSCULAR | Status: AC
  Filled 2013-06-29: qty 10

## 2013-06-29 MED ORDER — PREDNISOLONE ACETATE 1 % OP SUSP
1.0000 [drp] | Freq: Four times a day (QID) | OPHTHALMIC | Status: DC
  Filled 2013-06-29: qty 1

## 2013-06-29 MED ORDER — PROPOFOL 10 MG/ML IV BOLUS
INTRAVENOUS | Status: AC
  Filled 2013-06-29: qty 20

## 2013-06-29 MED ORDER — GATIFLOXACIN 0.5 % OP SOLN
1.0000 [drp] | OPHTHALMIC | Status: AC | PRN
  Administered 2013-06-29 (×2): 1 [drp] via OPHTHALMIC
  Filled 2013-06-29: qty 2.5

## 2013-06-29 MED ORDER — EPINEPHRINE HCL 1 MG/ML IJ SOLN
INTRAOCULAR | Status: DC | PRN
  Administered 2013-06-29: 11:00:00

## 2013-06-29 MED ORDER — SODIUM CHLORIDE 0.9 % IV SOLN
INTRAVENOUS | Status: DC
  Administered 2013-06-29: 10:00:00 via INTRAVENOUS

## 2013-06-29 MED ORDER — ARTIFICIAL TEARS OP OINT
TOPICAL_OINTMENT | OPHTHALMIC | Status: DC | PRN
  Administered 2013-06-29: 1 via OPHTHALMIC

## 2013-06-29 MED ORDER — FENTANYL CITRATE 0.05 MG/ML IJ SOLN
INTRAMUSCULAR | Status: AC
  Filled 2013-06-29: qty 5

## 2013-06-29 MED ORDER — SODIUM CHLORIDE 0.9 % IJ SOLN
INTRAMUSCULAR | Status: DC | PRN
  Administered 2013-06-29: 11:00:00

## 2013-06-29 MED ORDER — MAGNESIUM HYDROXIDE 400 MG/5ML PO SUSP: 15.0000 mL | Freq: Four times a day (QID) | ORAL | Status: DC | PRN

## 2013-06-29 MED ORDER — LATANOPROST 0.005 % OP SOLN
1.0000 [drp] | Freq: Every day | OPHTHALMIC | Status: DC
  Filled 2013-06-29: qty 2.5

## 2013-06-29 MED ORDER — HYDROMORPHONE HCL PF 1 MG/ML IJ SOLN: 0.2500 mg | INTRAMUSCULAR | Status: DC | PRN

## 2013-06-29 MED ORDER — BACITRACIN-POLYMYXIN B 500-10000 UNIT/GM OP OINT: 1.0000 "application " | TOPICAL_OINTMENT | Freq: Four times a day (QID) | OPHTHALMIC | Status: DC

## 2013-06-29 MED ORDER — PHENYLEPHRINE HCL 2.5 % OP SOLN
1.0000 [drp] | OPHTHALMIC | Status: AC | PRN
  Administered 2013-06-29 (×2): 1 [drp] via OPHTHALMIC
  Filled 2013-06-29: qty 2

## 2013-06-29 MED ORDER — BRIMONIDINE TARTRATE 0.2 % OP SOLN
1.0000 [drp] | Freq: Two times a day (BID) | OPHTHALMIC | Status: DC
  Filled 2013-06-29: qty 5

## 2013-06-29 MED ORDER — DOCUSATE SODIUM 100 MG PO CAPS
100.0000 mg | ORAL_CAPSULE | Freq: Two times a day (BID) | ORAL | Status: DC
  Filled 2013-06-29: qty 1

## 2013-06-29 SURGICAL SUPPLY — 55 items
BALL CTTN LRG ABS STRL LF (GAUZE/BANDAGES/DRESSINGS) ×3
BLADE KERATOME 2.75 (BLADE) ×2 IMPLANT
BLADE KERATOME 2.75MM (BLADE) ×1
CANNULA VLV SOFT TIP 25G (OPHTHALMIC) IMPLANT
CANNULA VLV SOFT TIP 25GA (OPHTHALMIC) IMPLANT
COTTONBALL LRG STERILE PKG (GAUZE/BANDAGES/DRESSINGS) ×9 IMPLANT
COVER MAYO STAND STRL (DRAPES) ×2 IMPLANT
DRAPE INCISE 51X51 W/FILM STRL (DRAPES) IMPLANT
DRAPE OPHTHALMIC 77X100 STRL (CUSTOM PROCEDURE TRAY) ×3 IMPLANT
GLOVE BIO SURGEON STRL SZ7 (GLOVE) ×2 IMPLANT
GLOVE SS BIOGEL STRL SZ 6.5 (GLOVE) ×2 IMPLANT
GLOVE SS BIOGEL STRL SZ 7 (GLOVE) ×1 IMPLANT
GLOVE SUPERSENSE BIOGEL SZ 6.5 (GLOVE) ×4
GLOVE SUPERSENSE BIOGEL SZ 7 (GLOVE) ×2
GLOVE SURG 8.5 LATEX PF (GLOVE) ×7 IMPLANT
GLOVE SURG SS PI 7.0 STRL IVOR (GLOVE) ×4 IMPLANT
GOWN STRL REUS W/ TWL LRG LVL3 (GOWN DISPOSABLE) ×3 IMPLANT
GOWN STRL REUS W/TWL LRG LVL3 (GOWN DISPOSABLE) ×12
KIT BASIN OR (CUSTOM PROCEDURE TRAY) ×3 IMPLANT
KIT ROOM TURNOVER OR (KITS) ×3 IMPLANT
LENS IOL POST 1PIECE DIOP 20.0 (Intraocular Lens) ×2 IMPLANT
MASK EYE SHIELD (GAUZE/BANDAGES/DRESSINGS) ×2 IMPLANT
MICROPICK 25G (MISCELLANEOUS)
NDL 18GX1X1/2 (RX/OR ONLY) (NEEDLE) ×1 IMPLANT
NDL 25GX 5/8IN NON SAFETY (NEEDLE) ×1 IMPLANT
NDL 27GX1/2 REG BEVEL ECLIP (NEEDLE) IMPLANT
NDL FILTER BLUNT 18X1 1/2 (NEEDLE) ×1 IMPLANT
NDL HYPO 30X.5 LL (NEEDLE) ×1 IMPLANT
NEEDLE 18GX1X1/2 (RX/OR ONLY) (NEEDLE) ×3 IMPLANT
NEEDLE 25GX 5/8IN NON SAFETY (NEEDLE) ×3 IMPLANT
NEEDLE 27GX1/2 REG BEVEL ECLIP (NEEDLE) ×6 IMPLANT
NEEDLE FILTER BLUNT 18X 1/2SAF (NEEDLE) ×2
NEEDLE FILTER BLUNT 18X1 1/2 (NEEDLE) ×1 IMPLANT
NEEDLE HYPO 30X.5 LL (NEEDLE) ×3 IMPLANT
NS IRRIG 1000ML POUR BTL (IV SOLUTION) ×3 IMPLANT
PACK VITRECTOMY CUSTOM (CUSTOM PROCEDURE TRAY) ×3 IMPLANT
PAD ARMBOARD 7.5X6 YLW CONV (MISCELLANEOUS) ×6 IMPLANT
PAD EYE OVAL STERILE LF (GAUZE/BANDAGES/DRESSINGS) ×2 IMPLANT
PAK PIK VITRECTOMY CVS 25GA (OPHTHALMIC) ×3 IMPLANT
PIC ILLUMINATED 25G (OPHTHALMIC) ×3
PICK MICROPICK 25G (MISCELLANEOUS) IMPLANT
PIK ILLUMINATED 25G (OPHTHALMIC) ×1 IMPLANT
PROBE LASER ILLUM FLEX CVD 25G (OPHTHALMIC) ×5 IMPLANT
ROLLS DENTAL (MISCELLANEOUS) ×6 IMPLANT
SPONGE SURGIFOAM ABS GEL 12-7 (HEMOSTASIS) ×3 IMPLANT
SUT CHROMIC 7 0 TG140 8 (SUTURE) ×3 IMPLANT
SUT ETHILON 10 0 CS140 6 (SUTURE) ×3 IMPLANT
SUT POLY NON ABSORB 10-0 8 STR (SUTURE) ×6 IMPLANT
SYR 20CC LL (SYRINGE) ×3 IMPLANT
SYR BULB 3OZ (MISCELLANEOUS) ×3 IMPLANT
SYR TB 1ML LUER SLIP (SYRINGE) ×3 IMPLANT
TAPE SURG TRANSPORE 1 IN (GAUZE/BANDAGES/DRESSINGS) ×1 IMPLANT
TAPE SURGICAL TRANSPORE 1 IN (GAUZE/BANDAGES/DRESSINGS) ×2
TOWEL OR 17X24 6PK STRL BLUE (TOWEL DISPOSABLE) ×9 IMPLANT
WATER STERILE IRR 1000ML POUR (IV SOLUTION) ×3 IMPLANT

## 2013-06-29 NOTE — Brief Op Note (Signed)
Brief Operative note   Preoperative diagnosis:  dislocated intra ocular lens left eye Postoperative diagnosis  Post-Op Diagnosis Codes:    * Posterior dislocation of lens [379.34]  Procedures: Pars plana vitrectomy, removal of intraocular lens, placement of secondary intraocular lens with suture left eye  Surgeon:  Hayden Pedro, MD...  Assistant:  Deatra Ina SA    Anesthesia: General  Specimen: none  Estimated blood loss:  1cc  Complications: none  Patient sent to PACU in good condition  Composed by Hayden Pedro MD  Dictation number: [id number]

## 2013-06-29 NOTE — H&P (Signed)
I examined the patient today and there is no change in the medical status 

## 2013-06-29 NOTE — Transfer of Care (Signed)
Immediate Anesthesia Transfer of Care Note  Patient: Leslie Gordon  Procedure(s) Performed: Procedure(s): PARS PLANA VITRECTOMY WITH 25G REMOVAL/SUTURE 2* INTRAOCULAR LENS (Left) GAS/FLUID EXCHANGE (Left)  Patient Location: PACU  Anesthesia Type:General  Level of Consciousness: awake, alert , oriented and sedated  Airway & Oxygen Therapy: Patient Spontanous Breathing and Patient connected to nasal cannula oxygen  Post-op Assessment: Report given to PACU RN, Post -op Vital signs reviewed and stable and Patient moving all extremities  Post vital signs: Reviewed and stable  Complications: No apparent anesthesia complications

## 2013-06-29 NOTE — Anesthesia Preprocedure Evaluation (Signed)
Anesthesia Evaluation  Patient identified by MRN, date of birth, ID band Patient awake    Reviewed: Allergy & Precautions, H&P , NPO status , Patient's Chart, lab work & pertinent test results  Airway Mallampati: II      Dental   Pulmonary shortness of breath and with exertion, Current Smoker,          Cardiovascular negative cardio ROS  Rhythm:Regular Rate:Normal     Neuro/Psych    GI/Hepatic Neg liver ROS, hiatal hernia,   Endo/Other  negative endocrine ROS  Renal/GU negative Renal ROS     Musculoskeletal   Abdominal   Peds  Hematology   Anesthesia Other Findings   Reproductive/Obstetrics                           Anesthesia Physical Anesthesia Plan  ASA: II  Anesthesia Plan: General   Post-op Pain Management:    Induction: Intravenous  Airway Management Planned: Oral ETT  Additional Equipment:   Intra-op Plan:   Post-operative Plan: Extubation in OR  Informed Consent: I have reviewed the patients History and Physical, chart, labs and discussed the procedure including the risks, benefits and alternatives for the proposed anesthesia with the patient or authorized representative who has indicated his/her understanding and acceptance.   Dental advisory given  Plan Discussed with: CRNA and Anesthesiologist  Anesthesia Plan Comments:         Anesthesia Quick Evaluation

## 2013-06-29 NOTE — Preoperative (Signed)
Beta Blockers   Reason not to administer Beta Blockers:Not Applicable 

## 2013-06-29 NOTE — Anesthesia Procedure Notes (Addendum)
Procedure Name: Intubation Date/Time: 06/29/2013 12:13 PM Performed by: Scheryl Darter Pre-anesthesia Checklist: Patient identified, Suction available, Emergency Drugs available, Patient being monitored and Timeout performed Patient Re-evaluated:Patient Re-evaluated prior to inductionOxygen Delivery Method: Circle system utilized Preoxygenation: Pre-oxygenation with 100% oxygen Intubation Type: IV induction Ventilation: Mask ventilation without difficulty Laryngoscope Size: Miller and 2 Grade View: Grade I Tube type: Oral Tube size: 7.5 mm Number of attempts: 1 Airway Equipment and Method: Stylet Placement Confirmation: ETT inserted through vocal cords under direct vision,  positive ETCO2 and breath sounds checked- equal and bilateral Secured at: 22 cm Tube secured with: Tape Dental Injury: Teeth and Oropharynx as per pre-operative assessment

## 2013-06-29 NOTE — Anesthesia Postprocedure Evaluation (Signed)
  Anesthesia Post-op Note  Patient: Leslie Gordon  Procedure(s) Performed: Procedure(s): PARS PLANA VITRECTOMY WITH 25G REMOVAL/SUTURE 2* INTRAOCULAR LENS (Left) GAS/FLUID EXCHANGE (Left)  Patient Location: PACU  Anesthesia Type:General  Level of Consciousness: awake  Airway and Oxygen Therapy: Patient Spontanous Breathing  Post-op Pain: mild  Post-op Assessment: Post-op Vital signs reviewed  Post-op Vital Signs: Reviewed  Complications: No apparent anesthesia complications

## 2013-06-30 ENCOUNTER — Encounter (HOSPITAL_COMMUNITY): Payer: Self-pay | Admitting: *Deleted

## 2013-06-30 ENCOUNTER — Encounter (HOSPITAL_COMMUNITY): Payer: Self-pay | Admitting: Ophthalmology

## 2013-06-30 MED ORDER — PREDNISOLONE ACETATE 1 % OP SUSP: 1.0000 [drp] | Freq: Four times a day (QID) | OPHTHALMIC | Status: AC

## 2013-06-30 MED ORDER — BACITRACIN-POLYMYXIN B 500-10000 UNIT/GM OP OINT: 1.0000 "application " | TOPICAL_OINTMENT | Freq: Four times a day (QID) | OPHTHALMIC | Status: AC

## 2013-06-30 MED ORDER — GATIFLOXACIN 0.5 % OP SOLN: 1.0000 [drp] | Freq: Four times a day (QID) | OPHTHALMIC | Status: AC

## 2013-06-30 NOTE — Op Note (Signed)
NAME:  Leslie Gordon, Leslie Gordon NO.:  [id number]  MEDICAL RECORD NO.:  [id number]  LOCATION:  [id number]                        FACILITY:  [facility]  PHYSICIAN:  Chrystie Nose. Zigmund Daniel, M.D. DATE OF BIRTH:  Aug 03, 1960  DATE OF PROCEDURE:  06/29/2013 DATE OF DISCHARGE:                              OPERATIVE REPORT   ADMISSION DIAGNOSIS:  Dislocated intraocular lens, left eye.  PROCEDURES: 1. Pars plana vitrectomy. 2. Removal of intraocular lens from vitreous. 3. Placement of secondary intraocular lens with vitrectomy and with     suture, anterior and posterior vitrectomy. 4. Gas fluid exchange, 30%.  SURGEON:  Chrystie Nose. Zigmund Daniel, M.D.  ASSISTANT:  Deatra Ina, SA  ANESTHESIA:  General.  DETAILS:  Usual prep and drape, conjunctival peritomy from 8 o'clock to 4 o'clock.  Half thickness scleral flaps were raised at 3 o'clock and 9 o'clock in anticipation of IOL suture.  Corneoscleral wound was opened from 10 o' clock to 2 o'clock with a 3 layered careful scleral corneal wound.  25-gauge trocars were placed at 10 o'clock, 2 o'clock, and 4 o'clock, infusion at 4 o'clock.  The lighted pick and the cutter were placed at 10 o'clock and 2 o'clock respectively.  Pars plana vitrectomy was begun just behind the pupillary axis.  The intraocular lens came into view immediately and there is white capsular material surrounding the lens. The white capsular material and vitreous was removed from their attachments to the lens.  The lens was freed from vitreous debris and traction.  It was grasped by a 25-gauge forceps and passed into the anterior chamber where Provisc was placed.  A 25-gauge forceps and Sinskey hook were used to tease the implant out of the eye.  The footplates fracture during the removal and each piece was removed. There was a total of 2 footplate pieces and 1 central oculus piece.  The capsule was cleaned and a capsulectomy was performed doing subtotal removal of capsule. Two  10-0 Prolene sutures were passed beneath the scleral flaps through the sclera posterior to the iris and anterior to the capsular remnants. They were passed from 9 o'clock to 3 o'clock. The 10-0 sutures were drawn out through the corneoscleral wound.  A new intraocular lens made by [employer] [id number],  power 20.0 D, length 12.5 mm, optic 7.0 mm, serial #[id number] was brought into place, expiration date April 08, 2015.  The lens was inspected and cleaned.  The Prolene sutures were attached to the eyelets of the lens. The lens was passed into the posterior chamber and dialed into place. Prolene sutures were drawn securely, knotted and the free ends removed. Scleral flaps were allowed to lie over the Prolene suture knots. The corneal wound was closed with 5 interrupted 10-0 nylon sutures.  The wound was tested and found to be secure. Additional vitrectomy occurred at this point.  Provisc was placed on the corneal surface, and the BIOM viewing system was moved into place.  A posterior vitrectomy was performed removing all vitreous from the core, central, and peripheral vitreous areas.  Once this was accomplished some additional capsular remnants were seen and these were carefully removed with low suction and rapid cutting.  A 30% gas fluid exchange was performed.  The 25-gauge trocars were removed.  The conjunctiva was reposited with 7-0 chromic suture.  Polymyxin and gentamicin were irrigated into tenon space. Marcaine was injected around the globe for postop pain.  Closing pressure was 10 with a Barraquer tonometer. Polysporin ophthalmic ointment, a patch and shield were placed.  The patient was awakened and taken to recovery in satisfactory condition. Complications none.  Duration 2 hours.     Chrystie Nose. Zigmund Daniel, M.D.     JDM/MEDQ  D:  06/29/2013  T:  06/29/2013  Job:  [id number]

## 2013-06-30 NOTE — Discharge Summary (Signed)
Discharge summary not needed on [facility] patients per medical records. 

## 2013-06-30 NOTE — Progress Notes (Signed)
06/30/2013, 6:14 AM  Mental Status:  Awake, Alert, Oriented  Anterior segment: Cornea  Clear    Anterior Chamber Clear    Lens:    IOL  Intra Ocular Pressure 19 mmHg with Tonopen  Vitreous: Clear 10%gas bubble   Retina:  Attached Good laser reaction   Impression: Excellent result Retina attached   Final Diagnosis: Active Problems:   Posterior dislocation of iol (intraocular lens), left eye   Plan: start post operative eye drops.  Discharge to home.  Give post operative instructions  Leslie Gordon 06/30/2013, 6:14 AM

## 2013-06-30 NOTE — Progress Notes (Signed)
Discharge home. Home discvharge instruction given, no questions verbalized.

## 2013-07-02 ENCOUNTER — Inpatient Hospital Stay (INDEPENDENT_AMBULATORY_CARE_PROVIDER_SITE_OTHER): Payer: 59 | Admitting: Ophthalmology

## 2013-07-02 DIAGNOSIS — H2700 Aphakia, unspecified eye: Secondary | ICD-10-CM

## 2013-07-13 ENCOUNTER — Encounter (INDEPENDENT_AMBULATORY_CARE_PROVIDER_SITE_OTHER): Payer: 59 | Admitting: Ophthalmology

## 2013-07-28 ENCOUNTER — Encounter (INDEPENDENT_AMBULATORY_CARE_PROVIDER_SITE_OTHER): Payer: 59 | Admitting: Ophthalmology

## 2013-07-28 DIAGNOSIS — H2700 Aphakia, unspecified eye: Secondary | ICD-10-CM

## 2013-08-27 ENCOUNTER — Encounter (INDEPENDENT_AMBULATORY_CARE_PROVIDER_SITE_OTHER): Payer: 59 | Admitting: Ophthalmology

## 2013-08-27 DIAGNOSIS — H2700 Aphakia, unspecified eye: Secondary | ICD-10-CM

## 2013-11-08 ENCOUNTER — Encounter (INDEPENDENT_AMBULATORY_CARE_PROVIDER_SITE_OTHER): Payer: 59 | Admitting: Ophthalmology

## 2013-11-08 DIAGNOSIS — H43819 Vitreous degeneration, unspecified eye: Secondary | ICD-10-CM

## 2013-11-08 DIAGNOSIS — D3130 Benign neoplasm of unspecified choroid: Secondary | ICD-10-CM

## 2013-11-08 DIAGNOSIS — H33009 Unspecified retinal detachment with retinal break, unspecified eye: Secondary | ICD-10-CM

## 2014-11-21 ENCOUNTER — Ambulatory Visit (INDEPENDENT_AMBULATORY_CARE_PROVIDER_SITE_OTHER): Payer: BLUE CROSS/BLUE SHIELD | Admitting: Ophthalmology

## 2014-11-21 DIAGNOSIS — D3131 Benign neoplasm of right choroid: Secondary | ICD-10-CM

## 2014-11-21 DIAGNOSIS — H338 Other retinal detachments: Secondary | ICD-10-CM | POA: Diagnosis not present

## 2015-10-05 ENCOUNTER — Other Ambulatory Visit: Payer: Self-pay | Admitting: Family Medicine

## 2015-10-05 DIAGNOSIS — Z1231 Encounter for screening mammogram for malignant neoplasm of breast: Secondary | ICD-10-CM

## 2015-10-11 ENCOUNTER — Ambulatory Visit
Admission: RE | Admit: 2015-10-11 | Discharge: 2015-10-11 | Disposition: A | Discharge status: Home / Self Care | Payer: BLUE CROSS/BLUE SHIELD | Source: Ambulatory Visit | Attending: Family Medicine | Admitting: Family Medicine

## 2015-10-11 DIAGNOSIS — Z1231 Encounter for screening mammogram for malignant neoplasm of breast: Secondary | ICD-10-CM

## 2015-11-22 ENCOUNTER — Ambulatory Visit (INDEPENDENT_AMBULATORY_CARE_PROVIDER_SITE_OTHER): Payer: BLUE CROSS/BLUE SHIELD | Admitting: Ophthalmology

## 2015-11-22 DIAGNOSIS — H43811 Vitreous degeneration, right eye: Secondary | ICD-10-CM

## 2015-11-22 DIAGNOSIS — H338 Other retinal detachments: Secondary | ICD-10-CM

## 2015-11-22 DIAGNOSIS — D3131 Benign neoplasm of right choroid: Secondary | ICD-10-CM | POA: Diagnosis not present

## 2016-11-25 ENCOUNTER — Ambulatory Visit (INDEPENDENT_AMBULATORY_CARE_PROVIDER_SITE_OTHER): Payer: BLUE CROSS/BLUE SHIELD | Admitting: Ophthalmology

## 2016-12-05 ENCOUNTER — Encounter (INDEPENDENT_AMBULATORY_CARE_PROVIDER_SITE_OTHER): Payer: BLUE CROSS/BLUE SHIELD | Admitting: Ophthalmology

## 2016-12-05 DIAGNOSIS — H43813 Vitreous degeneration, bilateral: Secondary | ICD-10-CM

## 2016-12-05 DIAGNOSIS — H338 Other retinal detachments: Secondary | ICD-10-CM | POA: Diagnosis not present

## 2016-12-05 DIAGNOSIS — D3131 Benign neoplasm of right choroid: Secondary | ICD-10-CM | POA: Diagnosis not present

## 2017-12-11 ENCOUNTER — Encounter (INDEPENDENT_AMBULATORY_CARE_PROVIDER_SITE_OTHER): Payer: 59 | Admitting: Ophthalmology

## 2017-12-11 DIAGNOSIS — D3131 Benign neoplasm of right choroid: Secondary | ICD-10-CM | POA: Diagnosis not present

## 2017-12-11 DIAGNOSIS — H338 Other retinal detachments: Secondary | ICD-10-CM | POA: Diagnosis not present

## 2018-12-16 ENCOUNTER — Other Ambulatory Visit: Payer: Self-pay

## 2018-12-16 ENCOUNTER — Encounter (INDEPENDENT_AMBULATORY_CARE_PROVIDER_SITE_OTHER): Payer: 59 | Admitting: Ophthalmology

## 2018-12-16 DIAGNOSIS — D3131 Benign neoplasm of right choroid: Secondary | ICD-10-CM | POA: Diagnosis not present

## 2018-12-16 DIAGNOSIS — H338 Other retinal detachments: Secondary | ICD-10-CM | POA: Diagnosis not present

## 2018-12-16 DIAGNOSIS — H43811 Vitreous degeneration, right eye: Secondary | ICD-10-CM

## 2019-12-16 ENCOUNTER — Encounter (INDEPENDENT_AMBULATORY_CARE_PROVIDER_SITE_OTHER): Payer: 59 | Admitting: Ophthalmology

## 2019-12-16 ENCOUNTER — Other Ambulatory Visit: Payer: Self-pay

## 2019-12-16 DIAGNOSIS — H338 Other retinal detachments: Secondary | ICD-10-CM

## 2019-12-16 DIAGNOSIS — D3131 Benign neoplasm of right choroid: Secondary | ICD-10-CM

## 2019-12-16 DIAGNOSIS — H43811 Vitreous degeneration, right eye: Secondary | ICD-10-CM | POA: Diagnosis not present

## 2020-12-21 ENCOUNTER — Encounter (INDEPENDENT_AMBULATORY_CARE_PROVIDER_SITE_OTHER): Payer: 59 | Admitting: Ophthalmology

## 2021-01-19 ENCOUNTER — Other Ambulatory Visit: Payer: Self-pay

## 2021-01-19 ENCOUNTER — Encounter (INDEPENDENT_AMBULATORY_CARE_PROVIDER_SITE_OTHER): Payer: 59 | Admitting: Ophthalmology

## 2021-01-19 DIAGNOSIS — H43811 Vitreous degeneration, right eye: Secondary | ICD-10-CM

## 2021-01-19 DIAGNOSIS — D3131 Benign neoplasm of right choroid: Secondary | ICD-10-CM | POA: Diagnosis not present

## 2022-01-25 ENCOUNTER — Encounter (INDEPENDENT_AMBULATORY_CARE_PROVIDER_SITE_OTHER): Payer: No Typology Code available for payment source | Admitting: Ophthalmology

## 2022-01-25 DIAGNOSIS — H43811 Vitreous degeneration, right eye: Secondary | ICD-10-CM | POA: Diagnosis not present

## 2022-01-25 DIAGNOSIS — D3131 Benign neoplasm of right choroid: Secondary | ICD-10-CM | POA: Diagnosis not present

## 2022-01-25 DIAGNOSIS — H338 Other retinal detachments: Secondary | ICD-10-CM

## 2023-01-29 ENCOUNTER — Encounter (INDEPENDENT_AMBULATORY_CARE_PROVIDER_SITE_OTHER): Payer: No Typology Code available for payment source | Admitting: Ophthalmology

## 2023-01-29 DIAGNOSIS — H338 Other retinal detachments: Secondary | ICD-10-CM

## 2023-01-29 DIAGNOSIS — D3131 Benign neoplasm of right choroid: Secondary | ICD-10-CM | POA: Diagnosis not present

## 2023-01-29 DIAGNOSIS — H43811 Vitreous degeneration, right eye: Secondary | ICD-10-CM

## 2023-10-17 ENCOUNTER — Encounter: Payer: Self-pay | Admitting: Advanced Practice Midwife

## 2024-01-30 ENCOUNTER — Encounter (INDEPENDENT_AMBULATORY_CARE_PROVIDER_SITE_OTHER): Payer: No Typology Code available for payment source | Admitting: Ophthalmology

## 2024-01-30 DIAGNOSIS — D3131 Benign neoplasm of right choroid: Secondary | ICD-10-CM | POA: Diagnosis not present

## 2024-01-30 DIAGNOSIS — H43811 Vitreous degeneration, right eye: Secondary | ICD-10-CM

## 2024-01-30 DIAGNOSIS — H338 Other retinal detachments: Secondary | ICD-10-CM | POA: Diagnosis not present

## 2025-01-31 ENCOUNTER — Encounter (INDEPENDENT_AMBULATORY_CARE_PROVIDER_SITE_OTHER): Admitting: Ophthalmology
